# Patient Record
Sex: Male | Born: 1955 | Race: Black or African American | Hispanic: No | Marital: Married | Smoking: Current some day smoker
Health system: Southern US, Community
[De-identification: ages and names within clinical notes are randomized; demographics above are authoritative.]

## PROBLEM LIST (undated history)

## (undated) DIAGNOSIS — N200 Calculus of kidney: Secondary | ICD-10-CM

---

## 1991-11-10 HISTORY — PX: INGUINAL HERNIA REPAIR: SUR1180

## 1999-02-17 ENCOUNTER — Encounter: Payer: Self-pay | Admitting: Emergency Medicine

## 1999-02-17 ENCOUNTER — Emergency Department (HOSPITAL_COMMUNITY): Admission: EM | Admit: 1999-02-17 | Discharge: 1999-02-17 | Payer: Self-pay | Admitting: Emergency Medicine

## 1999-10-15 ENCOUNTER — Encounter: Payer: Self-pay | Admitting: Emergency Medicine

## 1999-10-15 ENCOUNTER — Emergency Department (HOSPITAL_COMMUNITY): Admission: EM | Admit: 1999-10-15 | Discharge: 1999-10-15 | Payer: Self-pay | Admitting: Emergency Medicine

## 2000-09-03 ENCOUNTER — Emergency Department (HOSPITAL_COMMUNITY): Admission: EM | Admit: 2000-09-03 | Discharge: 2000-09-03 | Payer: Self-pay | Admitting: Internal Medicine

## 2001-09-05 ENCOUNTER — Emergency Department (HOSPITAL_COMMUNITY): Admission: EM | Admit: 2001-09-05 | Discharge: 2001-09-05 | Payer: Self-pay | Admitting: Emergency Medicine

## 2001-09-05 ENCOUNTER — Encounter: Payer: Self-pay | Admitting: Emergency Medicine

## 2001-11-09 HISTORY — PX: WRIST FRACTURE SURGERY: SHX121

## 2003-10-15 ENCOUNTER — Emergency Department (HOSPITAL_COMMUNITY): Admission: EM | Admit: 2003-10-15 | Discharge: 2003-10-15 | Payer: Self-pay | Admitting: Emergency Medicine

## 2003-10-18 ENCOUNTER — Emergency Department (HOSPITAL_COMMUNITY): Admission: EM | Admit: 2003-10-18 | Discharge: 2003-10-18 | Payer: Self-pay | Admitting: Emergency Medicine

## 2003-10-22 ENCOUNTER — Emergency Department (HOSPITAL_COMMUNITY): Admission: EM | Admit: 2003-10-22 | Discharge: 2003-10-22 | Payer: Self-pay | Admitting: Emergency Medicine

## 2004-11-09 HISTORY — PX: KNEE SURGERY: SHX244

## 2006-09-22 ENCOUNTER — Ambulatory Visit: Payer: Self-pay | Admitting: *Deleted

## 2006-09-22 ENCOUNTER — Inpatient Hospital Stay (HOSPITAL_COMMUNITY): Admission: EM | Admit: 2006-09-22 | Discharge: 2006-09-24 | Payer: Self-pay | Admitting: Emergency Medicine

## 2006-10-11 ENCOUNTER — Ambulatory Visit: Payer: Self-pay | Admitting: Internal Medicine

## 2006-10-11 ENCOUNTER — Encounter (INDEPENDENT_AMBULATORY_CARE_PROVIDER_SITE_OTHER): Payer: Self-pay | Admitting: Ophthalmology

## 2006-10-11 LAB — CONVERTED CEMR LAB
Basophils Absolute: 0.1 10*3/uL (ref 0.0–0.1)
Basophils Relative: 1 % (ref 0–1)
ENA SM Ab Ser-aCnc: <0.2 (ref <1.0)
Eosinophils Relative: 2 % (ref 0–4)
HCT: 35.8 % — ABNORMAL LOW (ref 41.0–49.0)
Hemoglobin: 11.9 g/dL — ABNORMAL LOW (ref 13.9–16.8)
Lymphs Abs: 1.9 10*3/uL (ref 0.8–3.1)
Monocytes Absolute: 0.7 10*3/uL (ref 0.2–0.7)
Monocytes Relative: 9 % (ref 3–11)
Scleroderma (Scl-70) (ENA) Antibody, IgG: <0.2 (ref <1.0)
WBC: 7.5 10*3/uL (ref 3.7–10.0)

## 2006-10-25 ENCOUNTER — Ambulatory Visit: Payer: Self-pay | Admitting: *Deleted

## 2006-10-25 DIAGNOSIS — R030 Elevated blood-pressure reading, without diagnosis of hypertension: Secondary | ICD-10-CM | POA: Insufficient documentation

## 2006-10-25 DIAGNOSIS — M130 Polyarthritis, unspecified: Secondary | ICD-10-CM | POA: Insufficient documentation

## 2007-04-18 ENCOUNTER — Encounter (INDEPENDENT_AMBULATORY_CARE_PROVIDER_SITE_OTHER): Payer: Self-pay | Admitting: *Deleted

## 2007-04-18 ENCOUNTER — Ambulatory Visit: Payer: Self-pay | Admitting: Internal Medicine

## 2007-04-18 ENCOUNTER — Encounter: Payer: Self-pay | Admitting: *Deleted

## 2007-04-18 DIAGNOSIS — M6282 Rhabdomyolysis: Secondary | ICD-10-CM

## 2007-04-18 LAB — CONVERTED CEMR LAB
Ketones, urine, test strip: NEGATIVE
Nitrite: NEGATIVE
Specific Gravity, Urine: >=1.03
pH: 5

## 2007-04-19 LAB — CONVERTED CEMR LAB
AST: 30 units/L (ref 0–37)
Albumin: 4.1 g/dL (ref 3.5–5.2)
Alkaline Phosphatase: 72 units/L (ref 39–117)
Creatinine, Ser: 1.64 mg/dL — ABNORMAL HIGH (ref 0.40–1.50)
Glucose, Bld: 95 mg/dL (ref 70–99)
Ketones, ur: NEGATIVE mg/dL
Potassium: 4.5 meq/L (ref 3.5–5.3)
Protein, ur: 30 mg/dL — AB
Urine Glucose: NEGATIVE mg/dL
Urobilinogen, UA: 0.2 (ref 0.0–1.0)
pH: 5.5 (ref 5.0–8.0)

## 2007-05-19 ENCOUNTER — Inpatient Hospital Stay (HOSPITAL_COMMUNITY): Admission: EM | Admit: 2007-05-19 | Discharge: 2007-05-26 | Payer: Self-pay | Admitting: Emergency Medicine

## 2007-05-20 ENCOUNTER — Ambulatory Visit: Payer: Self-pay | Admitting: Vascular Surgery

## 2007-05-20 ENCOUNTER — Encounter (INDEPENDENT_AMBULATORY_CARE_PROVIDER_SITE_OTHER): Payer: Self-pay | Admitting: Internal Medicine

## 2007-05-23 ENCOUNTER — Ambulatory Visit: Payer: Self-pay | Admitting: Infectious Diseases

## 2007-08-12 ENCOUNTER — Emergency Department (HOSPITAL_COMMUNITY): Admission: EM | Admit: 2007-08-12 | Discharge: 2007-08-12 | Payer: Self-pay | Admitting: Emergency Medicine

## 2008-01-19 ENCOUNTER — Emergency Department (HOSPITAL_COMMUNITY): Admission: EM | Admit: 2008-01-19 | Discharge: 2008-01-20 | Payer: Self-pay | Admitting: Emergency Medicine

## 2008-01-23 ENCOUNTER — Emergency Department (HOSPITAL_COMMUNITY): Admission: EM | Admit: 2008-01-23 | Discharge: 2008-01-23 | Payer: Self-pay | Admitting: Family Medicine

## 2008-06-04 ENCOUNTER — Emergency Department (HOSPITAL_COMMUNITY): Admission: EM | Admit: 2008-06-04 | Discharge: 2008-06-04 | Payer: Self-pay | Admitting: Emergency Medicine

## 2008-06-07 ENCOUNTER — Emergency Department (HOSPITAL_COMMUNITY): Admission: EM | Admit: 2008-06-07 | Discharge: 2008-06-07 | Payer: Self-pay | Admitting: Family Medicine

## 2009-08-06 ENCOUNTER — Emergency Department (HOSPITAL_COMMUNITY): Admission: EM | Admit: 2009-08-06 | Discharge: 2009-08-06 | Payer: Self-pay | Admitting: Emergency Medicine

## 2009-11-03 ENCOUNTER — Emergency Department (HOSPITAL_COMMUNITY): Admission: EM | Admit: 2009-11-03 | Discharge: 2009-11-03 | Payer: Self-pay | Admitting: Emergency Medicine

## 2010-12-20 ENCOUNTER — Emergency Department (HOSPITAL_COMMUNITY)
Admission: EM | Admit: 2010-12-20 | Discharge: 2010-12-20 | Disposition: A | Discharge status: Home / Self Care | Payer: No Typology Code available for payment source | Attending: Emergency Medicine | Admitting: Emergency Medicine

## 2010-12-20 ENCOUNTER — Emergency Department (HOSPITAL_COMMUNITY): Payer: No Typology Code available for payment source

## 2010-12-20 DIAGNOSIS — M25519 Pain in unspecified shoulder: Secondary | ICD-10-CM | POA: Insufficient documentation

## 2010-12-20 DIAGNOSIS — IMO0002 Reserved for concepts with insufficient information to code with codable children: Secondary | ICD-10-CM | POA: Insufficient documentation

## 2010-12-20 DIAGNOSIS — R404 Transient alteration of awareness: Secondary | ICD-10-CM | POA: Insufficient documentation

## 2010-12-20 DIAGNOSIS — M545 Low back pain, unspecified: Secondary | ICD-10-CM | POA: Insufficient documentation

## 2010-12-20 DIAGNOSIS — S139XXA Sprain of joints and ligaments of unspecified parts of neck, initial encounter: Secondary | ICD-10-CM | POA: Insufficient documentation

## 2010-12-20 DIAGNOSIS — M25539 Pain in unspecified wrist: Secondary | ICD-10-CM | POA: Insufficient documentation

## 2011-03-24 NOTE — Op Note (Signed)
NAME:  [name], [name] NO.:  [id number]   MEDICAL RECORD NO.:  [id number]          PATIENT TYPE:  INP   LOCATION:  5150                         FACILITY:  [facility]   PHYSICIAN:  Ollen Gross, M.D.    DATE OF BIRTH:  [birth year]   DATE OF PROCEDURE:  05/21/2007  DATE OF DISCHARGE:                               OPERATIVE REPORT   PREOPERATIVE DIAGNOSIS:  Infected left knee prepatellar bursa.   POSTOPERATIVE DIAGNOSIS:  Infected left knee prepatellar bursa.   PROCEDURE:  Incision and drainage, left knee and leg.   SURGEON:  Ollen Gross, M.D.   ANESTHESIA:  General.   ESTIMATED BLOOD LOSS:  Minimal.   TOURNIQUET TIME:  24 minutes at 300 mmHg.   DRAINS:  Hemovac x1 sewn in   COMPLICATIONS:  None.  Condition stable to recovery.   CLINICAL NOTE:  [name] is a 55 year old male who has been in hospital  for about two and half days now with a draining left knee wound.  Began  after he removed the piece of wood from his leg while fishing  approximately week ago.  We were consulted today by the [facility]  hospitalist team to examine his knee.  Upon examining him, he has gross  pus draining out of his pre- patella bursa with significant pain and  significant surrounding cellulitis and skin slough.  It was decided that  he needs to go to the operating room urgently for debridement.   PROCEDURE IN DETAIL:  After successful administration of general  anesthetic a tourniquet placed on the left thigh and left lower  extremity prepped and draped in usual sterile fashion.  I elevated the  extremity but did not wrap it in Esmarch.  The tourniquet inflated 300  mmHg.  Midline incision was made over the open area.  I started with  about a three inch incision.  There was gross pus that came out through  the incision.  I extended it total of about 3 inches proximal to this in  4 inches to distal this until we got about an inch of healthy tissue on  each side.  He had a necrotic  bursa which is excised.  He was started  developed necrotizing fasciitis.  The fascia medial and lateral and is  debrided back until we got to healthy-appearing tissue.  The  subcutaneous tissue also was abnormal appearing and debrided back to  more normal subcu tissue.  We thoroughly irrigated with 2 liters of  saline solution at that time.  Inspecting the tissue, it had a healthy  appearance after the irrigation.  I released the tourniquet for total  time of 24 minutes.  Minor bleeding was stopped with cautery.  Once  again we had healthy appearing tissue about an inch on the proximal  aspect and distal aspect of the wounds prior to debriding and then after  debriding, all the tissue had  healthy appearance.  I then placed a large Hemovac drain into the wound  bed and closed subcu tissue with interrupted 2-0 Vicryl and skin with  staples.  I had ellipsed out the previous  open area.  The drain was  hooked to suction and a bulky sterile dressing applied.  He is awakened  and transported to recovery in stable condition.      Ollen Gross, M.D.  Electronically Signed     FA/MEDQ  D:  05/21/2007  T:  05/23/2007  Job:  [id number]

## 2011-03-24 NOTE — Discharge Summary (Signed)
NAME:  MICK, TANGUMA NO.:  [id number]   MEDICAL RECORD NO.:  [id number]          PATIENT TYPE:  INP   LOCATION:  5114                         FACILITY:  [facility]   PHYSICIAN:  Hind Bosie Helper, MD      DATE OF BIRTH:  1956/02/25   DATE OF ADMISSION:  05/19/2007  DATE OF DISCHARGE:                               DISCHARGE SUMMARY   PRIMARY CARE PHYSICIAN:  Dr. Mikeal Hawthorne, phone number [phone]   DISCHARGE DIAGNOSES:  1. Infected left knee prepatellar bursa.  2. Early necrotizing fasciitis, status post incision and drainage.  3. Chronic renal insufficiency.  4. Hypokalemia, status post replacement.  5. Hyponatremia, status post replacement.   CONSULTATIONS:  Orthopedics consulted for the infection of the left knee  and infectious disease was consulted for the severity of necrotizing  fasciitis.   PROCEDURE:  X-ray on May 19, 2007:  No acute bony finding, mildly  stable degenerative change.  CT extremity:  Diffuse anterior soft tissue  swelling, fluid and edema reflect hematoma from direct trauma,  prepatellar bursitis or cellulitis, small joint effusion but no other  significant intraarticular abnormalities.  Ultrasound of the kidney,  bilateral, echogenic nonobstructive kidney disease compatible with  sequelae of medical renal disease.  There is asmall pleural effusion.   HISTORY:  Please review the history done by Dr. Mikeal Hawthorne on the date of  admission.   PROBLEM LIST:  1. Prepatellar bursitis, with surrounding cellulitis and necrotizing      fasciitis.  The patient was admitted and started on Zosyn and      vancomycin.  On day two, no significant improvement in the above      infection.  Orthopedics was consulted for evaluation of the      prepatellar bursitis and for possible septic bursitis.  Orthopedics      nicely did the consult, where they recommended the patient had to      go immediately to incision and drainage of the infected left knee      prepatellar  bursa and early necrotizing fasciitis.  After incision      and drainage, left knee and leg swelling improved significantly.      Also recommend infectious disease consultation due to the severity      of the necrotizing fasciitis.  Infectious disease, done by Dr.      Ninetta Lights, recommended the patient be discharged on Bactrim broad      spectrum three times a day for three weeks.  During      hospitalization, patient significantly improved and had venous      Doppler done to his leg, which does not show any evidence of DVT on      his lower leg.  2. Mild elevated creatinine.  The patient did not respond to the IV      fluid.  The ratio between the BUN and creatinine goes with renal      disease more than prerenal.  Ultrasound of the renal function was      done, which showed evidence of medical lkidney disease with chronic  kidney disease.  Cause of the chronic kidney disease is unknown to      [place].  Patient needs to follow up with nephrologist as an outpatient.      Will order SPEP and urine protein and electrophoresis and      parathyroid hormone and ANA, which were pending.  Further      recommendation to be done by his primary care, where he should      check his renal function every month.  To complete the workup, will      order chest x-ray.   DISPOSITION:  Patient to be discharged home after orthopedics clears.  Follow with his primary care for further recommendations regarding his  deteriorating renal function.  Patient may need to follow his kidney  function with a nephrologist as an outpatient.      Hind Bosie Helper, MD  Electronically Signed     HIE/MEDQ  D:  05/24/2007  T:  05/25/2007  Job:  [id number]

## 2011-03-24 NOTE — Discharge Summary (Signed)
NAME:  [name], [name] NO.:  [id number]   MEDICAL RECORD NO.:  [id number]          PATIENT TYPE:  INP   LOCATION:  5114                         FACILITY:  [facility]   PHYSICIAN:  Lonia Blood, M.D.       DATE OF BIRTH:  03/31/1956   DATE OF ADMISSION:  05/19/2007  DATE OF DISCHARGE:  05/26/2007                               DISCHARGE SUMMARY   PRIMARY CARE PHYSICIAN:  Dr. Mikeal Hawthorne   DISCHARGE DIAGNOSIS:  Left lower extremity methicillin-resistant  Staphylococcus aureus cellulitis with early necrotizing fascitis, status  post incision and debridement by Dr. Lequita Halt.   For complete list of discharge diagnoses refer to the previously  dictated discharge summary by Dr. Geradine Girt.   DISCHARGE MEDICATIONS:  1. Doxycycline 100 mg twice a day.  2. Percocet 5/325 every 6 hours as needed for pain.   CONDITION ON DISCHARGE:  Patient was discharged in good condition.  At  the time of discharge, he was instructed to followup with Dr. Lequita Halt on  May 31, 2007.  Patient will also follow up with Dr. Mikeal Hawthorne for followup  creatinine.  The discharge creatinine on this patient is 1.6.   HOSPITAL COURSE:  For hospital course and procedures refer to the  previously dictated discharge summary done by Dr. Geradine Girt.   From July 16 until May 26, 2007, the patient was observed for 24 more  hours and to complete one week of intravenous vancomycin.  His  leukocytosis and wound have remained stable.  There is still a scant  discharge appearing material from the knee, but overall clinically, the  patient has markedly improved.   PLAN:  The plan is to treat the patient with two weeks of oral  doxycycline and have the patient follow up with Dr. Lequita Halt.  In regards  to the patient's renal insufficiency, this has remained stable  throughout this period of time.  The patient has adequate urinary  output.  He will follow up with Dr. Mikeal Hawthorne.      Lonia Blood, M.D.  Electronically Signed     SL/MEDQ  D:  05/26/2007  T:  05/26/2007  Job:  [id number]   cc:   Lonia Blood, M.D.  Ollen Gross, M.D.

## 2011-03-24 NOTE — H&P (Signed)
NAME:  ELEK, HOLDERNESS NO.:  [id number]   MEDICAL RECORD NO.:  [id number]          PATIENT TYPE:  INP   LOCATION:  5150                         FACILITY:  [facility]   PHYSICIAN:  Lonia Blood, M.D.      DATE OF BIRTH:  04/28/1956   DATE OF ADMISSION:  05/19/2007  DATE OF DISCHARGE:                              HISTORY & PHYSICAL   PRIMARY CARE PHYSICIAN:  The patient is unassigned, but he will follow-  up with Dr. Mikeal Hawthorne on discharge.  The phone number is [phone].   CHIEF COMPLAINT:  Left knee swelling and pain.   HISTORY OF PRESENT ILLNESS:  The patient is a 55 year old African-  American male with no significant past medical history who was out  fishing six days ago and fell in a creek.  After falling, he had a tick  that became imbedded in his knee.  He was able to remove it himself.  Since then, he has had pain which has become progressive.  He returned  to [place] from the site of his fishing, which was [place],  [place], and was able to function rudimentarily and even returned to work.  In the past 48 hours, however, the pain is getting worse.  He has had  chills and cold sweats at night, especially last night.  He therefore  decided to come to the emergency room for further management.  He denies  any nausea and vomiting.  The knee swelling has discouraged him from  walking or putting weight on his left knee.  No other joints involved  and no other sites involved.   PAST MEDICAL HISTORY:  His past medical history is significant for a  tick bite, for which he was admitted in the hospital six months ago.   ALLERGIES:  HE HAS NO KNOWN DRUG ALLERGIES.   MEDICATIONS:  None.   SOCIAL HISTORY:  The patient is married and lives in [place] with his  wife.  He works for [employer].  Denies any tobacco use, although he smokes  some cigars.  Social alcohol intake and no IV drug use.   FAMILY HISTORY:  Significant mainly for some hypertension.   REVIEW OF SYSTEMS:   A 12-point review of systems performed is negative  except for HPI.   PHYSICAL EXAMINATION:  VITAL SIGNS:  On exam, his temperature is 99.1.  Blood pressure 133/76.  Pulse of 94.  Respiratory rate 22.  Sats 99% on  room air.  GENERAL:  He is a pleasant man, sharp and in no acute distress.  HEENT:  PERRLA.  EOMI.  Neck is supple.  No JVD, no lymphadenopathy.  RESPIRATORY:  He has good air entry bilaterally.  No wheezes, no rales.  CARDIOVASCULAR SYSTEM:  He has S1, S2, no murmurs.  ABDOMEN:  Soft and nontender with positive bowel sounds.  EXTREMITIES:  Left knee swollen, warm with multiple blisters anteriorly,  oozing pus.  Also red compared to the right side.   LABORATORY DATA:  Sodium 130, potassium 3.2, chloride 96, CO2 of 26,  glucose 110, BUN 18, creatinine 1.42, calcium 8.5, total protein 8.2,  albumin 3.1, AST 35.  White count is 20.3, hemoglobin 12.2, platelet  count 255,000.  X-ray of the left knee essentially showed mainly soft  tissue swelling.  CT left knee also showed soft tissue swelling anterior  to the knee, questionable prepatellar bursitis with the cellulitis, etc.   ASSESSMENT:  Therefore, this is a 55 year old gentleman presenting with  what appears to be cellulitis involving the anterior aspect of his left  knee, secondary to tick bite.  He received his last tetanus shot six  months ago.  He has not taken any antibiotics, but it looks like for  presentation that he has been having fevers and chills as well as  obvious evidence of cellulitis.  Fortunately, his knee was not involved  at least from the CT.   PLAN:  1. Cellulitis.  We will admit the patient for IV antibiotics.  I will      give him both vanc and Zosyn and take blood cultures.  We will also      try and do wound cultures if possible.  Pain control and then      follow patient's progress while in the hospital.  We will get PT/OT      as well as wound care to assist with patient's care.  2.  Hypokalemia.  The course is not entirely clear, however, we will      replete his potassium.  3. Hyponatremia.  There is evidence of possible dehydration.  We will      give him some saline infusion in addition to following his      electrolytes.  Otherwise patient seems to be stable and follow-up      treatment will depend on how he does while in the hospital.      Lonia Blood, M.D.  Electronically Signed     LG/MEDQ  D:  05/19/2007  T:  05/20/2007  Job:  [id number]

## 2011-03-27 NOTE — Discharge Summary (Signed)
NAME:  [name], [name] NO.:  [id number]   MEDICAL RECORD NO.:  [id number]          PATIENT TYPE:  INP   LOCATION:  6708                         FACILITY:  [facility]   PHYSICIAN:  Manning Charity, MD     DATE OF BIRTH:  1956/04/18   DATE OF ADMISSION:  09/22/2006  DATE OF DISCHARGE:  09/24/2006                               DISCHARGE SUMMARY   DISCHARGE DIAGNOSIS:  1. Polyarthritis, unknown etiology.  2. Nausea, vomiting and diarrhea, resolved prior to admission.  3. Leukocytosis, resolving.  4. Anemia.  5. Hypoalbuminemia.  6. History of an inguinal hernia repair in 1995.   DISCHARGE MEDICATIONS:  1. Doxycycline 100 mg p.o. b.i.d. x7 days.  2. Ibuprofen 200 mg p.o. q.6 hours p.r.n. pain.   CONDITION AT DISCHARGE:  Was stable and improved.  The patient was  instructed to follow up with Dr. Antony Contras in the outpatient clinic  on October 11, 2006 at 9 o'clock in the morning.  He was instructed to  call the outpatient clinic if he had any problems after going home.  The  patient had improved dramatically and we felt it would be safe to  discharge him home on p.o. doxycycline to complete a 10-day course of  antibiotic therapy.  On hospital follow-up it should be considered to  send the patient to a rheumatologist for further investigation regarding  his polyarthritis.   PROCEDURES:  1. A bilateral wrist x-ray on September 22, 2006 showed left wrist had      no acute bony abnormality.  Specifically no fracture, subluxation      or dislocation.  He had degenerative changes of the first carpal      metacarpal joint.  The right wrist revealed mild degenerative      changes of the first carpometacarpal joint.  No fracture,      subluxation or dislocation.  2. Chest x-ray on September 22, 2006 showed heart size was normal.      Vascularity was normal.  The lung volume is normal.  The lungs were      clear.  Negative for pneumonia.  There is a very small pleural  effusion on the lateral view.  3. Bilateral knee x-rays on September 22, 2006 showed left knee had a      moderate joint effusion present.  There is mild degenerative      changes noted.  No acute bony abnormality.  The right knee revealed      no acute bony abnormality.  Specifically no fracture, subluxation      or dislocation.  Mild degenerative changes.  No joint effusion.   CONSULTATIONS:  None.   ADMISSION H&P:  Please see the chart for full details. In summary Mr.  [name] is a 55 year old African American man with no significant past  medical history who presented to the emergency department complaining of  joint pain x2-1/2 weeks.  His complaints according to the patient  started about 3 weeks ago when he went fishing and received a tick bite  on his right knee.  About 5 days later he felt that tingling  on his head  and lower extremities and back.  The next day he had swelling of his  right foot and ankle and his right knees and hips in the migratory  pattern.  The swelling involved his elbows and now bilateral wrists and  fingers.  He did have some subjective fevers and chills on and off.  Also at some point a sore throat.  He has also had a cough for the past  5 days, some nausea, vomiting x1, and diarrhea on and off for the past  day.  He has been taking aspirin, Tylenol and Advil but no relief.  No  rash was seen by the patient but his wife told him 1 day 1-2 weeks prior  to admission that he had a rash of some type on his back.   PHYSICAL EXAMINATION:  VITAL SIGNS: Temperature was 97 degrees, blood  pressure 128/87, pulse 85, respiration rate 20 and he was saturating 98%  on room air.  Pertinent physical exam findings include:  NECK:  He had no neck stiffness.  LUNGS:  Lungs were clear with some faint rhonchi at the bases but no  crackles.  HEART:  Regular rate and rhythm with no murmurs.  ABDOMEN:  Benign.  EXTREMITIES:  He had no lower extremity edema.  Pulses were  normal.  Testes were normal.  Penis was normal and no ulcers or lesions were  noted.  Skin was warm.  There is no rash noted.  He had no  lymphadenopathy.  MUSCULOSKELETAL:  Exam revealed normal range of motion of the shoulders  and elbows. Wrists were tender to palpation with probably some effusion  bilaterally at the MCP joints of the third through fifth fingers, the  interphalangeal joints bilaterally.  His knees were mildly tender to  palpation with a mild effusion on the left.  Ankles and feet were  normal.   LABORATORY DATA ON ADMISSION:  Sodium 136, potassium 3.6, chloride 101,  bicarb 26, glucose 100, BUN 15, creatinine 0.9, total bili was 0.7,  alkaline phosphatase 76, AST 24, ALT 24, total protein 7.2, albumin 2.9,  calcium 8.4. White blood cell count 17.8, hemoglobin 12.8, hematocrit  38.4, MCV 84, RDW 13.9, platelets 231, ANC 14.6. UA revealed a specific  gravity of 1.034, 15 of ketones and 100 protein with 0-2 WBCs, 3-6 RBCs,  rare bacteria and mucus.   HOSPITAL COURSE:  1. Polyarthritis of unknown etiology. The patient, because of his      history of a question of a tick bite even though it was 3 weeks      prior to admission, was started on doxycycline immediately upon      arrival to the emergency department.  He did have a moderate left      knee effusion that was unable to be tapped prior to the start of      antibiotics.  Given his bilateral wrist tenderness and edema as      well as his left knee edema, it was felt that the patient had some      sort of a polyarthritis.  A rheumatological workup was begun at      that time and given his leukocytosis and presenting complaints.      The patient received doxycycline immediately in the emergency      department and throughout his hospitalization and reported dramatic      improvement in his symptoms 24 hours after the first dose of his  doxycycline.  The wrist tenderness, restriction in range of motion,     and  edema all decreased quite significantly over the first 24 hours      of his hospitalization and the left knee effusion was practically      gone 24 hours after the first dose of doxycycline.  This is felt to      be a bit unusual and felt to be less likely secondary to the      doxycycline and more likely spontaneous resolution.  Given the      likely rheumatological nature of this patient's presentation we did      obtain several rheumatological laboratories including an ANA which      was positive at 1:40 with a speckled pattern.  C-reactive protein      was elevated at 17.7.  His rheumatoid factor was elevated to 35 and      his ESR was elevated at 77.  Given the leukocytosis and normocytic      anemia, there is a strong possibility that this patient does have a      rheumatological disease that has not been delineated as of yet.  He      will need close follow-up in the outpatient setting including      perhaps a referral to rheumatologist for further workup.  Given his      dramatic improvement on the doxycycline it was felt that it would      be safe to discharge him as he was afebrile and his leukocytosis      was resolving and his complaints were resolving on doxycycline to      complete his 10-day course.  Of note his GC and chlamydia probe was      negative which ruled out a Gonococcal arthritis, his [facility]      Spotted Fever antibody was negative which ruled out RMSF as a cause      of his presenting symptoms.  His HIV antibody was nonreactive and      his HIV RNA was essentially negative.  The RPR was nonreactive.      Influenza A and B was negative as well.  Urinary drug screen was      only positive for THC and opiates.  Blood cultures were negative x2      and finalized after 5 days.  His UA on repeat was negative.  His      chest x-ray did not reveal pneumonia as possible source of      infection.  We felt that the leukocytosis was most likely a      reactive  leukocytosis secondary to an underlying rheumatological      disorder.  Again the patient would probably benefit from an      outpatient workup by a rheumatologist at this point.  And again      because he was stable and afebrile and everything was resolving it      was felt to be safe to discharge him on p.o. antibiotics with close      follow-up in the outpatient clinic.  2. Nausea, vomiting and diarrhea.  The patient presented with a      complaint of nausea and vomiting x1 and diarrhea.  However, this      seemed to have resolved prior to admission.  The patient did not      experience any nausea, vomiting or diarrhea during his short  hospitalization.  Initially he was given IV fluids to volume      replete him but was able to tolerate p.o.'s his second day of      hospitalization and these were discontinued and he was monitored on      just p.o. intake.  The patient did well and was discharged without      any further episodes of nausea, vomiting or diarrhea reported. 3. Leukocytosis. On the day of admission his white blood cell count      was 17.8 which decreased to 16.1 and then 15 on the subsequent days      of hospitalization.  Since the patient's leukocytosis and symptoms      were improving on doxycycline we decided to continue this for a      total of 10 days of antibiotics.  His leukocytosis was not found to      have been from an infectious etiology as his chest x-ray was clear.      His urinalysis was negative and his blood cultures were negative as      well.  It was felt to be more likely secondary to an underlying      rheumatological disorder and this will be further worked up as an      outpatient.  A CBC should be checked in the outpatient setting to      ensure resolution of the leukocytosis.  The patient remained      afebrile throughout his hospitalization.  4. Anemia, normocytic. The patient's hemoglobin on the day of      admission was 12.8 with an MCV of 84  and an RDW of 13.9.  Given      this constellation of symptoms this could be an acute anemia      secondary to inflammatory reaction from underlying rheumatological      disorder.  The patient did not have any signs or symptoms of blood      loss. His blood pressure remained stable.  He was not orthostatic      and his kidney function was within normal limits.  The patient is      55 years old and certainly would benefit from an outpatient      screening colonoscopy given his newly diagnosed normocytic anemia.      He will also need an anemia panel and to have a Hemoccult as an      outpatient as well.  Since there are no signs of overt bleeding, it      was felt to be appropriate to focus on his presenting complaints      and leave the anemia workup as an outpatient process.  The      patient's hemoglobin on the day of discharge was 11.1 and he was      asymptomatic.  5. Hypoalbuminemia. It was noted on the day of admission that the      patient's albumin was 2.9.  While his urine drug screen was      negative for most illicit drugs, it was positive for marijuana use      which may be contributing to his hypoalbuminemic state.  The      patient did also admit to daily alcohol use.  The patient has no      other past medical history to account for the hypoalbuminemia and      this was also considered to be part of the likely underlying  rheumatological disorder associated with the patient's presenting      complaints.  He could also be hypoalbuminemic secondary to his      acute illness and we can check for resolution and in the outpatient      setting as well.  Of note, it does not appear that he has any liver      disease that would be contributing to his low albumin.   DISCHARGE LABS AND VITAL SIGNS:  On the day of discharge temperature was  98.3, pulse was 50, respirations 18, blood pressure 114/61, and he was  saturating at 95% on room air. White blood cell count was 15,   hemoglobin 11.1, hematocrit 32.7, MCV 84.2, RDW 14, platelets 233. Sodium 136, potassium 3.9, chloride 102, bicarb 26, glucose 88, BUN 11,  creatinine 0.9, calcium 8.6.   PENDING LABS:  None.      Chauncey Reading, D.O.  Electronically Signed     ______________________________  Manning Charity, MD    EA/MEDQ  D:  10/18/2006  T:  10/18/2006  Job:  [id number]   cc:   Antony Contras, M.D.

## 2011-08-07 LAB — URINALYSIS, ROUTINE W REFLEX MICROSCOPIC
Bilirubin Urine: NEGATIVE
Glucose, UA: NEGATIVE
Ketones, ur: NEGATIVE
Leukocytes, UA: NEGATIVE
pH: 7

## 2011-08-07 LAB — DIFFERENTIAL
Basophils Relative: 0
Eosinophils Absolute: 0
Eosinophils Relative: 0
Neutrophils Relative %: 85 — ABNORMAL HIGH

## 2011-08-07 LAB — COMPREHENSIVE METABOLIC PANEL
ALT: 25
AST: 36
Alkaline Phosphatase: 65
CO2: 24
Chloride: 103
GFR calc Af Amer: 52 — ABNORMAL LOW
GFR calc non Af Amer: 43 — ABNORMAL LOW
Glucose, Bld: 137 — ABNORMAL HIGH
Potassium: 3.7
Sodium: 142

## 2011-08-07 LAB — URINE MICROSCOPIC-ADD ON

## 2011-08-07 LAB — POCT I-STAT, CHEM 8
Glucose, Bld: 140 — ABNORMAL HIGH
Hemoglobin: 13.9
Sodium: 145

## 2011-08-07 LAB — URINE CULTURE: Culture: NO GROWTH

## 2011-08-07 LAB — POCT URINALYSIS DIP (DEVICE)
Ketones, ur: NEGATIVE
Operator id: 247071
Protein, ur: 30 — AB
Specific Gravity, Urine: >=1.03
Urobilinogen, UA: 0.2
pH: 5

## 2011-08-07 LAB — CBC
Hemoglobin: 12.8 — ABNORMAL LOW
RBC: 4.67
WBC: 11.3 — ABNORMAL HIGH

## 2011-08-07 LAB — LIPASE, BLOOD: Lipase: 25

## 2011-08-20 LAB — I-STAT 8, (EC8 V) (CONVERTED LAB)
Acid-base deficit: 1
BUN: 32 — ABNORMAL HIGH
Bicarbonate: 23.8
HCT: 39
Hemoglobin: 13.3
Operator id: 272551
Sodium: 139
pCO2, Ven: 40.1 — ABNORMAL LOW

## 2011-08-20 LAB — POCT I-STAT CREATININE: Creatinine, Ser: 1.4

## 2011-08-24 LAB — URINALYSIS, ROUTINE W REFLEX MICROSCOPIC
Bilirubin Urine: NEGATIVE
Glucose, UA: NEGATIVE
Ketones, ur: NEGATIVE
Nitrite: NEGATIVE
Protein, ur: NEGATIVE
Specific Gravity, Urine: 1.005
Urobilinogen, UA: 0.2
pH: 7

## 2011-08-24 LAB — BASIC METABOLIC PANEL
BUN: 8
BUN: 9
CO2: 27
Calcium: 8.5
Calcium: 8.5
Calcium: 8.7
Chloride: 102
Chloride: 105
Chloride: 105
Creatinine, Ser: 1.6 — ABNORMAL HIGH
Creatinine, Ser: 1.61 — ABNORMAL HIGH
Creatinine, Ser: 1.66 — ABNORMAL HIGH
GFR calc Af Amer: 53 — ABNORMAL LOW
GFR calc Af Amer: 55 — ABNORMAL LOW
GFR calc non Af Amer: 45 — ABNORMAL LOW
Glucose, Bld: 154 — ABNORMAL HIGH
Potassium: 3.5
Sodium: 137
Sodium: 139

## 2011-08-24 LAB — CBC
HCT: 31.7 — ABNORMAL LOW
Hemoglobin: 10.6 — ABNORMAL LOW
MCHC: 33.3
MCV: 83.2
Platelets: 310
RBC: 3.82 — ABNORMAL LOW
RBC: 3.96 — ABNORMAL LOW
RDW: 14.7 — ABNORMAL HIGH
WBC: 17.2 — ABNORMAL HIGH
WBC: 17.7 — ABNORMAL HIGH

## 2011-08-24 LAB — URINE MICROSCOPIC-ADD ON

## 2011-08-25 LAB — TISSUE CULTURE

## 2011-08-25 LAB — DIFFERENTIAL
Basophils Absolute: 0
Basophils Absolute: 0
Basophils Absolute: 0
Basophils Relative: 0
Basophils Relative: 0
Eosinophils Absolute: 0.1
Eosinophils Absolute: 0.1
Eosinophils Relative: 1
Eosinophils Relative: 1
Lymphocytes Relative: 15
Lymphocytes Relative: 9 — ABNORMAL LOW
Lymphs Abs: 1.2
Lymphs Abs: 1.7
Lymphs Abs: 2.2
Monocytes Absolute: 2 — ABNORMAL HIGH
Monocytes Relative: 11
Monocytes Relative: 8
Monocytes Relative: 8
Neutro Abs: 11.5 — ABNORMAL HIGH
Neutro Abs: 14 — ABNORMAL HIGH
Neutro Abs: 17.5 — ABNORMAL HIGH
Neutrophils Relative %: 76
Neutrophils Relative %: 79 — ABNORMAL HIGH

## 2011-08-25 LAB — BASIC METABOLIC PANEL
BUN: 9
CO2: 28
Calcium: 8 — ABNORMAL LOW
Calcium: 8.6
Chloride: 101
Creatinine, Ser: 1.56 — ABNORMAL HIGH
Creatinine, Ser: 1.58 — ABNORMAL HIGH
GFR calc Af Amer: 56 — ABNORMAL LOW
GFR calc non Af Amer: 46 — ABNORMAL LOW
GFR calc non Af Amer: 47 — ABNORMAL LOW
GFR calc non Af Amer: >60
Glucose, Bld: 108 — ABNORMAL HIGH
Glucose, Bld: 111 — ABNORMAL HIGH
Glucose, Bld: 99
Potassium: 3.7
Potassium: 3.9
Sodium: 134 — ABNORMAL LOW
Sodium: 135
Sodium: 137

## 2011-08-25 LAB — WOUND CULTURE

## 2011-08-25 LAB — CBC
HCT: 33.7 — ABNORMAL LOW
HCT: 34.8 — ABNORMAL LOW
Hemoglobin: 10.5 — ABNORMAL LOW
Hemoglobin: 11.3 — ABNORMAL LOW
Hemoglobin: 11.7 — ABNORMAL LOW
MCHC: 33.1
MCV: 83.5
MCV: 83.9
Platelets: 255
Platelets: 279
RBC: 4.12 — ABNORMAL LOW
RBC: 4.21 — ABNORMAL LOW
RDW: 15.1 — ABNORMAL HIGH
RDW: 15.4 — ABNORMAL HIGH
RDW: 15.5 — ABNORMAL HIGH
WBC: 15.2 — ABNORMAL HIGH
WBC: 18 — ABNORMAL HIGH
WBC: 18.9 — ABNORMAL HIGH
WBC: 20.3 — ABNORMAL HIGH

## 2011-08-25 LAB — ANAEROBIC CULTURE

## 2011-08-25 LAB — COMPREHENSIVE METABOLIC PANEL
AST: 35
Albumin: 3.1 — ABNORMAL LOW
Alkaline Phosphatase: 82
Chloride: 96
GFR calc Af Amer: >60
Potassium: 3.2 — ABNORMAL LOW
Sodium: 130 — ABNORMAL LOW
Total Bilirubin: 1.1

## 2011-08-25 LAB — CULTURE, BLOOD (ROUTINE X 2): Culture: NO GROWTH

## 2011-08-25 LAB — PROTEIN ELECTROPH W RFLX QUANT IMMUNOGLOBULINS
Beta 2: 8.4 — ABNORMAL HIGH
Gamma Globulin: 26.7 — ABNORMAL HIGH

## 2011-08-25 LAB — VANCOMYCIN, TROUGH: Vancomycin Tr: 18

## 2013-12-07 ENCOUNTER — Encounter: Payer: Self-pay | Admitting: Gastroenterology

## 2014-01-11 ENCOUNTER — Ambulatory Visit (AMBULATORY_SURGERY_CENTER): Payer: Self-pay | Admitting: *Deleted

## 2014-01-11 VITALS — Ht 76.0 in | Wt 208.4 lb

## 2014-01-11 DIAGNOSIS — Z1211 Encounter for screening for malignant neoplasm of colon: Secondary | ICD-10-CM

## 2014-01-11 MED ORDER — NA SULFATE-K SULFATE-MG SULF 17.5-3.13-1.6 GM/177ML PO SOLN: 1.0000 | Freq: Once | ORAL | Status: DC

## 2014-01-11 NOTE — Progress Notes (Signed)
No allergies to eggs or soy. No problems with anesthesia.  

## 2014-01-15 ENCOUNTER — Encounter: Payer: Self-pay | Admitting: Gastroenterology

## 2014-01-25 ENCOUNTER — Encounter: Payer: BC Managed Care – PPO | Admitting: Gastroenterology

## 2014-03-07 ENCOUNTER — Encounter: Payer: BC Managed Care – PPO | Admitting: Gastroenterology

## 2015-05-21 ENCOUNTER — Emergency Department (HOSPITAL_COMMUNITY): Payer: BLUE CROSS/BLUE SHIELD

## 2015-05-21 ENCOUNTER — Emergency Department (HOSPITAL_COMMUNITY)
Admission: EM | Admit: 2015-05-21 | Discharge: 2015-05-21 | Disposition: A | Discharge status: Home / Self Care | Payer: BLUE CROSS/BLUE SHIELD | Attending: Emergency Medicine | Admitting: Emergency Medicine

## 2015-05-21 ENCOUNTER — Encounter (HOSPITAL_COMMUNITY): Payer: Self-pay | Admitting: Emergency Medicine

## 2015-05-21 DIAGNOSIS — R319 Hematuria, unspecified: Secondary | ICD-10-CM | POA: Diagnosis present

## 2015-05-21 DIAGNOSIS — N200 Calculus of kidney: Secondary | ICD-10-CM | POA: Diagnosis not present

## 2015-05-21 DIAGNOSIS — Z79899 Other long term (current) drug therapy: Secondary | ICD-10-CM | POA: Insufficient documentation

## 2015-05-21 DIAGNOSIS — R109 Unspecified abdominal pain: Secondary | ICD-10-CM

## 2015-05-21 DIAGNOSIS — Z720 Tobacco use: Secondary | ICD-10-CM | POA: Diagnosis not present

## 2015-05-21 HISTORY — DX: Calculus of kidney: N20.0

## 2015-05-21 LAB — CBC
HEMATOCRIT: 38.6 % — AB (ref 39.0–52.0)
Hemoglobin: 12.6 g/dL — ABNORMAL LOW (ref 13.0–17.0)
MCH: 27.2 pg (ref 26.0–34.0)
MCHC: 32.6 g/dL (ref 30.0–36.0)
MCV: 83.2 fL (ref 78.0–100.0)
PLATELETS: 247 10*3/uL (ref 150–400)
RBC: 4.64 MIL/uL (ref 4.22–5.81)
RDW: 15.2 % (ref 11.5–15.5)
WBC: 11.3 10*3/uL — ABNORMAL HIGH (ref 4.0–10.5)

## 2015-05-21 LAB — URINE MICROSCOPIC-ADD ON

## 2015-05-21 LAB — COMPREHENSIVE METABOLIC PANEL
ALBUMIN: 3.5 g/dL (ref 3.5–5.0)
ALK PHOS: 62 U/L (ref 38–126)
ALT: 23 U/L (ref 17–63)
AST: 25 U/L (ref 15–41)
Anion gap: 11 (ref 5–15)
BUN: 20 mg/dL (ref 6–20)
CALCIUM: 9.3 mg/dL (ref 8.9–10.3)
CO2: 22 mmol/L (ref 22–32)
CREATININE: 1.51 mg/dL — AB (ref 0.61–1.24)
Chloride: 105 mmol/L (ref 101–111)
GFR calc non Af Amer: 48 mL/min — ABNORMAL LOW (ref >60)
GFR, EST AFRICAN AMERICAN: 56 mL/min — AB (ref >60)
GLUCOSE: 134 mg/dL — AB (ref 65–99)
Potassium: 3.5 mmol/L (ref 3.5–5.1)
Sodium: 138 mmol/L (ref 135–145)
TOTAL PROTEIN: 8 g/dL (ref 6.5–8.1)
Total Bilirubin: 0.4 mg/dL (ref 0.3–1.2)

## 2015-05-21 LAB — URINALYSIS, ROUTINE W REFLEX MICROSCOPIC
BILIRUBIN URINE: NEGATIVE
GLUCOSE, UA: NEGATIVE mg/dL
KETONES UR: NEGATIVE mg/dL
LEUKOCYTES UA: NEGATIVE
Nitrite: NEGATIVE
PH: 5 (ref 5.0–8.0)
PROTEIN: NEGATIVE mg/dL
Specific Gravity, Urine: 1.011 (ref 1.005–1.030)
Urobilinogen, UA: 0.2 mg/dL (ref 0.0–1.0)

## 2015-05-21 MED ORDER — OXYCODONE-ACETAMINOPHEN 5-325 MG PO TABS
2.0000 | ORAL_TABLET | Freq: Once | ORAL | Status: AC
  Administered 2015-05-21: 2 via ORAL
  Filled 2015-05-21: qty 2

## 2015-05-21 MED ORDER — OXYCODONE-ACETAMINOPHEN 5-325 MG PO TABS
1.0000 | ORAL_TABLET | Freq: Four times a day (QID) | ORAL | Status: DC | PRN
Start: 2015-05-21 — End: 2018-07-13

## 2015-05-21 MED ORDER — HYDROMORPHONE HCL 1 MG/ML IJ SOLN: 1.0000 mg | Freq: Once | INTRAMUSCULAR | Status: DC

## 2015-05-21 MED ORDER — HYDROMORPHONE HCL 1 MG/ML IJ SOLN
1.0000 mg | Freq: Once | INTRAMUSCULAR | Status: AC
Start: 2015-05-21 — End: 2015-05-21
  Administered 2015-05-21: 1 mg via INTRAMUSCULAR
  Filled 2015-05-21: qty 1

## 2015-05-21 MED ORDER — TAMSULOSIN HCL 0.4 MG PO CAPS: 0.4000 mg | ORAL_CAPSULE | Freq: Every day | ORAL | Status: AC

## 2015-05-21 MED ORDER — ONDANSETRON HCL 4 MG PO TABS: 4.0000 mg | ORAL_TABLET | Freq: Four times a day (QID) | ORAL | Status: AC

## 2015-05-21 MED ORDER — METOCLOPRAMIDE HCL 5 MG/ML IJ SOLN
10.0000 mg | Freq: Once | INTRAMUSCULAR | Status: AC
  Administered 2015-05-21: 10 mg via INTRAVENOUS
  Filled 2015-05-21: qty 2

## 2015-05-21 MED ORDER — KETOROLAC TROMETHAMINE 30 MG/ML IJ SOLN
30.0000 mg | Freq: Once | INTRAMUSCULAR | Status: AC
  Administered 2015-05-21: 30 mg via INTRAVENOUS
  Filled 2015-05-21: qty 1

## 2015-05-21 MED ORDER — SODIUM CHLORIDE 0.9 % IV BOLUS (SEPSIS)
1000.0000 mL | Freq: Once | INTRAVENOUS | Status: AC
  Administered 2015-05-21: 1000 mL via INTRAVENOUS

## 2015-05-21 NOTE — ED Notes (Signed)
PA at bedside.

## 2015-05-21 NOTE — ED Notes (Signed)
Pt. reports hematuria with right abdominal/right flank pain onset this evening , pt. stated history of kidney stone / denies fever .

## 2015-05-21 NOTE — Discharge Instructions (Signed)

## 2015-05-21 NOTE — ED Provider Notes (Signed)
CSN: [id number]     Arrival date & time 05/21/15  0217 History   This chart was scribed for non-physician practitioner, Antony Madura, PA-C  working with Shon Baton, MD by Arlan Organ, ED Scribe. This patient was seen in room A02C/A02C and the patient's care was started at 2:50 AM.   Chief Complaint  Patient presents with  . Hematuria   The history is provided by the patient. No language interpreter was used.    HPI Comments: [name] is a 59 y.o. male with a PMHx of kidney stones who presents to the Emergency Department complaining of sudden onset, constant, ongoing hematuria onset earlier this evening. Pt states he noted a "stone" in the toilet after using the bathroom. [name] also reports nausea, vomiting, R sided abdominal pain, and R sided flank pain. 3 episodes of vomiting reports since time of onset. No OTC medications or home remedies attempted for any of the above symptoms. No recent fever or chills. No previous stent placement or treatments needed for previous kidney stones. No known allergies to medications.  Past Medical History  Diagnosis Date  . Kidney stones    Past Surgical History  Procedure Laterality Date  . Inguinal hernia repair Left 1993  . Wrist fracture surgery Left 2003    with hardware  . 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 Colon cancer Neg Hx    History  Substance Use Topics  . Smoking status: Current Some Day Smoker    Types: Cigars  . Smokeless tobacco: Never Used     Comment: occasional cigar  . Alcohol Use: 0.0 oz/week     Comment: beer several times a year    Review of Systems  Constitutional: Negative for fever and chills.  Gastrointestinal: Positive for nausea, vomiting and abdominal pain.  Genitourinary: Positive for hematuria and flank pain.  All other systems reviewed and are negative.   Allergies  Review of patient's allergies indicates no known  allergies.  Home Medications   Prior to Admission medications   Medication Sig Start Date End Date Taking? Authorizing Provider  Multiple Vitamin (MULTIVITAMIN WITH MINERALS) TABS tablet Take 1 tablet by mouth daily.   Yes Historical Provider, MD  ondansetron (ZOFRAN) 4 MG tablet Take 1 tablet (4 mg total) by mouth every 6 (six) hours. 05/21/15   Antony Madura, PA-C  oxyCODONE-acetaminophen (PERCOCET/ROXICET) 5-325 MG per tablet Take 1-2 tablets by mouth every 6 (six) hours as needed for severe pain. 05/21/15   Antony Madura, PA-C  tamsulosin (FLOMAX) 0.4 MG CAPS capsule Take 1 capsule (0.4 mg total) by mouth daily. 05/21/15   Antony Madura, PA-C   Triage Vitals: BP 156/81 mmHg  Pulse 58  Temp(Src) 97.9 F (36.6 C) (Oral)  Resp 17  Ht 6\' 4"  (1.93 m)  SpO2 99%   Physical Exam  Constitutional: He is oriented to person, place, and time. He appears well-developed and well-nourished. No distress.  Patient appears uncomfortable.  HENT:  Head: Normocephalic and atraumatic.  Eyes: Conjunctivae and EOM are normal. No scleral icterus.  Neck: Normal range of motion.  Cardiovascular: Normal rate, regular rhythm and intact distal pulses.   Pulmonary/Chest: Effort normal and breath sounds normal. No respiratory distress. He has no wheezes. He has no rales.  Respirations even and unlabored  Abdominal: Soft. He exhibits no distension. There is tenderness. There is no rebound and no guarding.  TTP in  the R mid abdomen and R suprapubic region. Abdomen soft. No masses or peritoneal signs.  Musculoskeletal: Normal range of motion.  Neurological: He is alert and oriented to person, place, and time. He exhibits normal muscle tone. Coordination normal.  Skin: Skin is warm and dry. No rash noted. He is not diaphoretic. No erythema. No pallor.  Psychiatric: He has a normal mood and affect. His behavior is normal.  Nursing note and vitals reviewed.   ED Course  Procedures (including critical care  time)  DIAGNOSTIC STUDIES: Oxygen Saturation is 99% on RA, Normal by my interpretation.    COORDINATION OF CARE: 2:53 AM- Will give fluids, Reglan, Dilaudid, and Toradol. Will order urinalysis, CBC, and CMP. Discussed treatment plan with pt at bedside and pt agreed to plan.     Labs Review Labs Reviewed  COMPREHENSIVE METABOLIC PANEL - Abnormal; Notable for the following:    Glucose, Bld 134 (*)    Creatinine, Ser 1.51 (*)    GFR calc non Af Amer 48 (*)    GFR calc Af Amer 56 (*)    All other components within normal limits  CBC - Abnormal; Notable for the following:    WBC 11.3 (*)    Hemoglobin 12.6 (*)    HCT 38.6 (*)    All other components within normal limits  URINALYSIS, ROUTINE W REFLEX MICROSCOPIC (NOT AT [facility]) - Abnormal; Notable for the following:    Hgb urine dipstick LARGE (*)    All other components within normal limits  URINE MICROSCOPIC-ADD ON    Imaging Review Ct Renal Stone Study  05/21/2015   CLINICAL DATA:  Sudden onset severe right flank pain. Gross hematuria. History of kidney stones.  EXAM: CT ABDOMEN AND PELVIS WITHOUT CONTRAST  TECHNIQUE: Multidetector CT imaging of the abdomen and pelvis was performed following the standard protocol without IV contrast.  COMPARISON:  None.  FINDINGS: Dependent atelectasis in the lung bases.  There is a 4 mm stone in the mid right ureter at the level of L3-4. There is proximal ureterectasis and hydronephrosis with distal decompression of the ureter. There is prominent stranding around the right kidney in ureter. Additional intrarenal stones in both kidneys. No hydronephrosis or hydroureter on the left. No bladder stones. Bladder wall is mildly thickened.  Unenhanced appearance of the liver, spleen, gallbladder, pancreas, adrenal glands, abdominal aorta, inferior vena cava, and retroperitoneal lymph nodes is unremarkable. Stomach, small bowel, and colon are not abnormally distended. No free air or free fluid in the abdomen.   Pelvis: Appendix is normal. Prostate gland is enlarged. No free or loculated pelvic fluid collections. No pelvic mass or lymphadenopathy. Degenerative changes in the spine. No destructive bone lesions.  IMPRESSION: 4 mm stone in the mid right ureter with moderate proximal obstruction. Additional nonobstructing intrarenal stones bilaterally.   Electronically Signed   By: Burman Nieves M.D.   On: 05/21/2015 03:49     EKG Interpretation None      MDM   Final diagnoses:  Right flank pain  Kidney stone    Pt has been diagnosed with a kidney stone via CT. CT shows moderate hydronephrosis; creatinine at baseline compared to prior work ups. UA negative for UTI. Vitals sign stable. Patient afebrile and he is not experiencing irratractable vomiting. His pain is well controlled after Toradol and Dilaudid. Pt will be discharged home with pain medications and Flomax. He has been advised to follow-up with a urologist and his primary care provider. Return precautions discussed and  provided. Patient agreeable to plan with no unaddressed concerns. Patient discharged in good condition; VSS.  I personally performed the services described in this documentation, which was scribed in my presence. The recorded information has been reviewed and is accurate.   Filed Vitals:   05/21/15 0400 05/21/15 0415 05/21/15 0430 05/21/15 0500  BP: 156/82 153/79 156/81 149/81  Pulse: 54 54 58 49  Temp:      TempSrc:      Resp: 17 15 17 15   Height:      SpO2: 96% 97% 99% 100%     Antony MaduraKelly Dorethia Jeanmarie, PA-C 05/21/15 [id number]  Shon Batonourtney F Horton, MD 05/21/15 [id number]

## 2015-05-21 NOTE — ED Notes (Signed)
Notified PA that pt is sinus brady and HR is between 50-45

## 2015-05-21 NOTE — ED Notes (Signed)
Patient transported to CT 

## 2017-09-14 ENCOUNTER — Other Ambulatory Visit: Payer: Self-pay

## 2017-09-14 ENCOUNTER — Emergency Department (HOSPITAL_COMMUNITY): Payer: BLUE CROSS/BLUE SHIELD

## 2017-09-14 ENCOUNTER — Emergency Department (HOSPITAL_COMMUNITY)
Admission: EM | Admit: 2017-09-14 | Discharge: 2017-09-14 | Disposition: A | Discharge status: Home / Self Care | Payer: BLUE CROSS/BLUE SHIELD | Attending: Emergency Medicine | Admitting: Emergency Medicine

## 2017-09-14 ENCOUNTER — Encounter (HOSPITAL_COMMUNITY): Payer: Self-pay

## 2017-09-14 DIAGNOSIS — R55 Syncope and collapse: Secondary | ICD-10-CM | POA: Insufficient documentation

## 2017-09-14 DIAGNOSIS — Z79899 Other long term (current) drug therapy: Secondary | ICD-10-CM | POA: Insufficient documentation

## 2017-09-14 DIAGNOSIS — F17290 Nicotine dependence, other tobacco product, uncomplicated: Secondary | ICD-10-CM | POA: Diagnosis not present

## 2017-09-14 DIAGNOSIS — S0990XA Unspecified injury of head, initial encounter: Secondary | ICD-10-CM | POA: Diagnosis not present

## 2017-09-14 DIAGNOSIS — Y92410 Unspecified street and highway as the place of occurrence of the external cause: Secondary | ICD-10-CM | POA: Diagnosis not present

## 2017-09-14 DIAGNOSIS — R202 Paresthesia of skin: Secondary | ICD-10-CM | POA: Diagnosis not present

## 2017-09-14 DIAGNOSIS — M25511 Pain in right shoulder: Secondary | ICD-10-CM | POA: Diagnosis not present

## 2017-09-14 DIAGNOSIS — Y999 Unspecified external cause status: Secondary | ICD-10-CM | POA: Diagnosis not present

## 2017-09-14 DIAGNOSIS — Y9389 Activity, other specified: Secondary | ICD-10-CM | POA: Insufficient documentation

## 2017-09-14 LAB — COMPREHENSIVE METABOLIC PANEL
ALBUMIN: 3.6 g/dL (ref 3.5–5.0)
ALT: 32 U/L (ref 17–63)
AST: 30 U/L (ref 15–41)
Alkaline Phosphatase: 76 U/L (ref 38–126)
Anion gap: 10 (ref 5–15)
BILIRUBIN TOTAL: 0.6 mg/dL (ref 0.3–1.2)
BUN: 29 mg/dL — AB (ref 6–20)
CO2: 25 mmol/L (ref 22–32)
Calcium: 9.1 mg/dL (ref 8.9–10.3)
Chloride: 103 mmol/L (ref 101–111)
Creatinine, Ser: 1.32 mg/dL — ABNORMAL HIGH (ref 0.61–1.24)
GFR calc Af Amer: >60 mL/min (ref >60)
GFR calc non Af Amer: 56 mL/min — ABNORMAL LOW (ref >60)
GLUCOSE: 136 mg/dL — AB (ref 65–99)
POTASSIUM: 3.6 mmol/L (ref 3.5–5.1)
Sodium: 138 mmol/L (ref 135–145)
Total Protein: 8.4 g/dL — ABNORMAL HIGH (ref 6.5–8.1)

## 2017-09-14 LAB — CBC WITH DIFFERENTIAL/PLATELET
BASOS PCT: 1 %
Basophils Absolute: 0.1 10*3/uL (ref 0.0–0.1)
EOS PCT: 2 %
Eosinophils Absolute: 0.2 10*3/uL (ref 0.0–0.7)
HCT: 36.1 % — ABNORMAL LOW (ref 39.0–52.0)
Hemoglobin: 11.6 g/dL — ABNORMAL LOW (ref 13.0–17.0)
Lymphocytes Relative: 34 %
Lymphs Abs: 3.1 10*3/uL (ref 0.7–4.0)
MCH: 26.9 pg (ref 26.0–34.0)
MCHC: 32.1 g/dL (ref 30.0–36.0)
MCV: 83.6 fL (ref 78.0–100.0)
MONO ABS: 0.8 10*3/uL (ref 0.1–1.0)
Monocytes Relative: 9 %
Neutro Abs: 4.8 10*3/uL (ref 1.7–7.7)
Neutrophils Relative %: 54 %
Platelets: 258 10*3/uL (ref 150–400)
RBC: 4.32 MIL/uL (ref 4.22–5.81)
RDW: 15.4 % (ref 11.5–15.5)
WBC: 8.9 10*3/uL (ref 4.0–10.5)

## 2017-09-14 MED ORDER — CYCLOBENZAPRINE HCL 10 MG PO TABS: 10.0000 mg | ORAL_TABLET | Freq: Every evening | ORAL | 0 refills | Status: AC | PRN

## 2017-09-14 MED ORDER — MORPHINE SULFATE (PF) 4 MG/ML IV SOLN
4.0000 mg | Freq: Once | INTRAVENOUS | Status: AC
  Administered 2017-09-14: 4 mg via INTRAVENOUS
  Filled 2017-09-14: qty 1
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
## 2017-09-14 NOTE — ED Notes (Signed)
Patient transported to MRI 

## 2017-09-14 NOTE — ED Notes (Signed)
ED Provider at bedside. 

## 2017-09-14 NOTE — Discharge Instructions (Signed)
Today you received medications that may make you sleepy or impair your ability to make decisions.  For the next 24 hours please do not drive, operate heavy machinery, care for a small child with out another adult present, or perform any activities that may cause harm to you or someone else if you were to fall asleep or be impaired.   You are being prescribed a medication which may make you sleepy. Please follow up of listed precautions for at least 24 hours after taking one dose.  Please take Ibuprofen (Advil, motrin) and Tylenol (acetaminophen) to relieve your pain.  You may take up to 600 MG (3 pills) of normal strength ibuprofen every 8 hours as needed.  In between doses of ibuprofen you make take tylenol, up to 1,000 mg (two extra strength pills).  Do not take more than 3,000 mg tylenol in a 24 hour period.  Please check all medication labels as many medications such as pain and cold medications may contain tylenol.  Do not drink alcohol while taking these medications.  Do not take other NSAID'S while taking ibuprofen (such as aleve or naproxen).  Please take ibuprofen with food to decrease stomach upset.  The best way to get rid of muscle pain is by taking NSAIDS, using heat, massage therapy, and gentle stretching/range of motion exercises.  Please do not use a sling.  That will only cause you more problems with your shoulder in the long run.
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
## 2017-09-14 NOTE — ED Notes (Signed)
Patient transported to X-ray 
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
## 2018-07-13 ENCOUNTER — Encounter (HOSPITAL_COMMUNITY): Payer: Self-pay | Admitting: Emergency Medicine

## 2018-07-13 ENCOUNTER — Ambulatory Visit (HOSPITAL_COMMUNITY)
Admission: EM | Admit: 2018-07-13 | Discharge: 2018-07-13 | Disposition: A | Discharge status: Home / Self Care | Payer: BLUE CROSS/BLUE SHIELD | Attending: Family Medicine | Admitting: Family Medicine

## 2018-07-13 DIAGNOSIS — S81801A Unspecified open wound, right lower leg, initial encounter: Secondary | ICD-10-CM

## 2018-07-13 DIAGNOSIS — W57XXXA Bitten or stung by nonvenomous insect and other nonvenomous arthropods, initial encounter: Secondary | ICD-10-CM | POA: Insufficient documentation

## 2018-07-13 DIAGNOSIS — R030 Elevated blood-pressure reading, without diagnosis of hypertension: Secondary | ICD-10-CM

## 2018-07-13 DIAGNOSIS — S80862A Insect bite (nonvenomous), left lower leg, initial encounter: Secondary | ICD-10-CM | POA: Insufficient documentation

## 2018-07-13 MED ORDER — SULFAMETHOXAZOLE-TRIMETHOPRIM 800-160 MG PO TABS: 1.0000 | ORAL_TABLET | Freq: Two times a day (BID) | ORAL | 0 refills | Status: AC

## 2018-07-13 NOTE — Discharge Instructions (Signed)
We did lab testing during this visit.  If there are any abnormal findings that require change in medicine or indicate a positive result, you will be notified.  If all of your tests are normal, you will not be called.  I am checking for MRSA Take the antibiotic 2 x a day Wash wound and use antibiotic ointment and a bandaid daily Return if worse at any time,  expect improvement over a few days

## 2018-07-13 NOTE — ED Provider Notes (Signed)
[facility]    CSN: [id number] Arrival date & time: 07/13/18  1820     History   Chief Complaint Chief Complaint  Patient presents with  . Insect Bite    HPI [name] is a 62 y.o. male.   HPI  Patient is here for a wound on his right leg.  It is mildly painful.  No fever or chills.  He thinks is a spider bite.  He states that he was outside cooking on his grill, and came inside with an insect on his leg.  He states that his wife knocked it off his leg and stomped on it.  He thinks it was a brown spider.  She has looked this up and he now he thinks it might have been a brown recluse.  The center of the wound is turned black.  The skin around the area is swollen.  No drainage.  No malaise, nausea vomiting diarrhea, or additional symptoms. Mention to the patient that his blood pressure was quite elevated.  He states it is always high when he goes to the doctor.  He does have a primary care doctor.  He goes for yearly physical.  His blood pressure is always normal.  I have advised him to follow-up with his primary care doctor for his blood pressure.  He states that he feels well with no headache.  Past Medical History:  Diagnosis Date  . Kidney stones     Patient Active Problem List   Diagnosis Date Noted  . RHABDOMYOLYSIS 04/18/2007  . POLYARTHRITIS NOS, MULTIPLE SITES 10/25/2006  . ELEVATED BLOOD PRESSURE WITHOUT DIAGNOSIS OF HYPERTENSION 10/25/2006    Past Surgical History:  Procedure Laterality Date  . INGUINAL HERNIA REPAIR Left 1993  . 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 WRIST FRACTURE SURGERY Left 2003   with hardware       Home Medications    Prior to Admission medications   Medication Sig Start Date End Date Taking? Authorizing Provider  cyclobenzaprine (FLEXERIL) 10 MG tablet Take 1 tablet (10 mg total) at bedtime as needed by mouth for muscle spasms. 09/14/17   Cristina Gong, PA-C  Multiple Vitamin (MULTIVITAMIN WITH  MINERALS) TABS tablet Take 1 tablet by mouth daily.    [provider]  ondansetron (ZOFRAN) 4 MG tablet Take 1 tablet (4 mg total) by mouth every 6 (six) hours. 05/21/15   Antony Madura, PA-C  sulfamethoxazole-trimethoprim (BACTRIM DS,SEPTRA DS) 800-160 MG tablet Take 1 tablet by mouth 2 (two) times daily for 7 days. 07/13/18 07/20/18  Eustace Moore, MD  tamsulosin (FLOMAX) 0.4 MG CAPS capsule Take 1 capsule (0.4 mg total) by mouth daily. 05/21/15   Antony Madura, PA-C    Family History Family History  Problem Relation Age of Onset  . Colon cancer Neg Hx     Social History Social History   Tobacco Use  . Smoking status: Current Some Day Smoker    Types: Cigars  . Smokeless tobacco: Never Used  . Tobacco comment: occasional cigar  Substance Use Topics  . Alcohol use: Yes    Comment: beer several times a year  . Drug use: No     Allergies   Patient has no known allergies.   Review of Systems Review of Systems  Constitutional: Negative for chills and fever.  HENT: Negative for ear pain and sore throat.   Eyes: Negative for pain and visual disturbance.  Respiratory: Negative for cough and  shortness of breath.   Cardiovascular: Negative for chest pain and palpitations.  Gastrointestinal: Negative for abdominal pain and vomiting.  Genitourinary: Negative for dysuria and hematuria.  Musculoskeletal: Negative for arthralgias and back pain.  Skin: Positive for wound. Negative for color change and rash.  Neurological: Negative for seizures and syncope.  All other systems reviewed and are negative.    Physical Exam Triage Vital Signs ED Triage Vitals [07/13/18 1835]  Enc Vitals Group     BP (!) 183/104     Pulse Rate 77     Resp 16     Temp 97.9 F (36.6 C)     Temp Source Oral     SpO2 98 %     Weight      Height      Head Circumference      Peak Flow      Pain Score      Pain Loc      Pain Edu?      Excl. in GC?    No data found.  Updated Vital  Signs BP (!) 183/104 (BP Location: Left Arm)   Pulse 77   Temp 97.9 F (36.6 C) (Oral)   Resp 16   SpO2 98%   Visual Acuity Right Eye Distance:   Left Eye Distance:   Bilateral Distance:    Right Eye Near:   Left Eye Near:    Bilateral Near:     Physical Exam  Constitutional: He appears well-developed and well-nourished. No distress.  HENT:  Head: Normocephalic and atraumatic.  Mouth/Throat: Oropharynx is clear and moist.  Eyes: Pupils are equal, round, and reactive to light. Conjunctivae are normal.  Neck: Normal range of motion.  Cardiovascular: Normal rate.  Pulmonary/Chest: Effort normal. No respiratory distress.  Abdominal: Soft. He exhibits no distension.  Musculoskeletal: Normal range of motion. He exhibits no edema.  Neurological: He is alert.  Skin: Skin is warm and dry.  Right lower leg, lateral aspect has mild soft tissue swelling.  There is a 15 mm hyperpigmented macule with a center approximately 7 mm that has superficial eschar.  This is unroofed.  There is slight bleeding.  There is no purulence.  There is no fluctuance.  There is no skin necrosis.  Vitals reviewed.    UC Treatments / Results  Labs (all labs ordered are listed, but only abnormal results are displayed) Labs Reviewed  AEROBIC CULTURE (SUPERFICIAL SPECIMEN)    EKG None  Radiology No results found.  Procedures Procedures (including critical care time)  Medications Ordered in UC Medications - No data to display  Initial Impression / Assessment and Plan / UC Course  I have reviewed the triage vital signs and the nursing notes.  Pertinent labs & imaging results that were available during my care of the patient were reviewed by me and considered in my medical decision making (see chart for details).     I explained to the patient this may have been an insect bite.  It is not a brown recluse.  There may be soft tissue infection.  This would cause swelling and the pain.  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  I am going to do a culture.  I am going to put him on antibiotics to cover for MRSA.  He will return if worse instead of better.  Wound care discussed. Final Clinical Impressions(s) / UC Diagnoses   Final diagnoses:  Leg wound, right, initial encounter  Elevated blood  pressure reading without diagnosis of hypertension     Discharge Instructions     We did lab testing during this visit.  If there are any abnormal findings that require change in medicine or indicate a positive result, you will be notified.  If all of your tests are normal, you will not be called.  I am checking for MRSA Take the antibiotic 2 x a day Wash wound and use antibiotic ointment and a bandaid daily Return if worse at any time,  expect improvement over a few days     ED Prescriptions    Medication Sig Dispense Auth. Provider   sulfamethoxazole-trimethoprim (BACTRIM DS,SEPTRA DS) 800-160 MG tablet Take 1 tablet by mouth 2 (two) times daily for 7 days. 14 tablet Eustace Moore, MD     Controlled Substance Prescriptions [place] Controlled Substance Registry consulted? Not Applicable   Eustace Moore, MD 07/13/18 Jerene Bears

## 2018-07-13 NOTE — ED Triage Notes (Signed)
Pt here for right lower leg possible spider bite x 2 days

## 2018-07-16 LAB — AEROBIC CULTURE W GRAM STAIN (SUPERFICIAL SPECIMEN): Special Requests: NORMAL

## 2018-07-16 LAB — AEROBIC CULTURE  (SUPERFICIAL SPECIMEN)
CULTURE: NO GROWTH
GRAM STAIN: NONE SEEN

## 2020-11-05 ENCOUNTER — Other Ambulatory Visit (HOSPITAL_COMMUNITY): Payer: Self-pay | Admitting: Family Medicine

## 2020-11-05 DIAGNOSIS — I509 Heart failure, unspecified: Secondary | ICD-10-CM

## 2020-11-19 ENCOUNTER — Other Ambulatory Visit (HOSPITAL_COMMUNITY): Payer: BLUE CROSS/BLUE SHIELD

## 2020-11-22 ENCOUNTER — Encounter (HOSPITAL_COMMUNITY): Payer: Self-pay | Admitting: Family Medicine

## 2020-11-29 ENCOUNTER — Telehealth (HOSPITAL_COMMUNITY): Payer: Self-pay | Admitting: Family Medicine

## 2020-11-29 NOTE — Telephone Encounter (Signed)
Just an FYI. We have made several attempts to contact this patient including sending a letter to schedule or reschedule their echocardiogram. We will be removing the patient from the echo WQ.   11/28/20 [facility] to schedule @1548  evd 11/22/2020 NO SHOW- MAILED LETTER [facility] 11/1120 [facility] to schedule @ 2:31/[facility]      Thank you

## 2021-02-10 DIAGNOSIS — I509 Heart failure, unspecified: Secondary | ICD-10-CM | POA: Diagnosis not present

## 2021-02-10 DIAGNOSIS — I110 Hypertensive heart disease with heart failure: Secondary | ICD-10-CM | POA: Diagnosis not present

## 2021-03-11 ENCOUNTER — Ambulatory Visit (HOSPITAL_COMMUNITY): Payer: Medicare Other | Attending: Cardiovascular Disease

## 2021-03-11 ENCOUNTER — Other Ambulatory Visit: Payer: Self-pay

## 2021-03-11 DIAGNOSIS — I509 Heart failure, unspecified: Secondary | ICD-10-CM | POA: Insufficient documentation

## 2021-03-11 LAB — ECHOCARDIOGRAM COMPLETE
Area-P 1/2: 1.68 cm2
S' Lateral: 2.9 cm

## 2021-04-15 DIAGNOSIS — R001 Bradycardia, unspecified: Secondary | ICD-10-CM | POA: Diagnosis not present

## 2021-04-15 DIAGNOSIS — I110 Hypertensive heart disease with heart failure: Secondary | ICD-10-CM | POA: Diagnosis not present

## 2021-04-15 DIAGNOSIS — I509 Heart failure, unspecified: Secondary | ICD-10-CM | POA: Diagnosis not present

## 2021-04-15 DIAGNOSIS — N200 Calculus of kidney: Secondary | ICD-10-CM | POA: Diagnosis not present

## 2021-06-05 DIAGNOSIS — I10 Essential (primary) hypertension: Secondary | ICD-10-CM | POA: Diagnosis not present

## 2021-06-13 DIAGNOSIS — I10 Essential (primary) hypertension: Secondary | ICD-10-CM | POA: Diagnosis not present

## 2024-10-11 ENCOUNTER — Emergency Department (HOSPITAL_COMMUNITY)

## 2024-10-11 ENCOUNTER — Encounter (HOSPITAL_COMMUNITY): Payer: Self-pay

## 2024-10-11 ENCOUNTER — Other Ambulatory Visit: Payer: Self-pay

## 2024-10-11 ENCOUNTER — Emergency Department (HOSPITAL_COMMUNITY)
Admission: EM | Admit: 2024-10-11 | Discharge: 2024-10-11 | Disposition: A | Discharge status: Home / Self Care | Attending: Emergency Medicine | Admitting: Emergency Medicine

## 2024-10-11 DIAGNOSIS — Z79899 Other long term (current) drug therapy: Secondary | ICD-10-CM | POA: Insufficient documentation

## 2024-10-11 DIAGNOSIS — Y92009 Unspecified place in unspecified non-institutional (private) residence as the place of occurrence of the external cause: Secondary | ICD-10-CM | POA: Insufficient documentation

## 2024-10-11 DIAGNOSIS — N3091 Cystitis, unspecified with hematuria: Secondary | ICD-10-CM | POA: Insufficient documentation

## 2024-10-11 DIAGNOSIS — R55 Syncope and collapse: Secondary | ICD-10-CM | POA: Insufficient documentation

## 2024-10-11 DIAGNOSIS — W19XXXA Unspecified fall, initial encounter: Secondary | ICD-10-CM | POA: Insufficient documentation

## 2024-10-11 LAB — URINE DRUG SCREEN
Amphetamines: NEGATIVE
Barbiturates: NEGATIVE
Benzodiazepines: NEGATIVE
Cocaine: NEGATIVE
Fentanyl: NEGATIVE
Methadone Scn, Ur: NEGATIVE
Opiates: NEGATIVE
Tetrahydrocannabinol: POSITIVE — AB

## 2024-10-11 LAB — CBC
HCT: 38.3 % — ABNORMAL LOW (ref 39.0–52.0)
Hemoglobin: 11.8 g/dL — ABNORMAL LOW (ref 13.0–17.0)
MCH: 26.6 pg (ref 26.0–34.0)
MCHC: 30.8 g/dL (ref 30.0–36.0)
MCV: 86.5 fL (ref 80.0–100.0)
Platelets: 309 K/uL (ref 150–400)
RBC: 4.43 MIL/uL (ref 4.22–5.81)
RDW: 15 % (ref 11.5–15.5)
WBC: 9.8 K/uL (ref 4.0–10.5)
nRBC: 0 % (ref 0.0–0.2)

## 2024-10-11 LAB — URINALYSIS, ROUTINE W REFLEX MICROSCOPIC
Bilirubin Urine: NEGATIVE
Glucose, UA: NEGATIVE mg/dL
Ketones, ur: NEGATIVE mg/dL
Nitrite: NEGATIVE
Protein, ur: 30 mg/dL — AB
Specific Gravity, Urine: 1.013 (ref 1.005–1.030)
pH: 6 (ref 5.0–8.0)

## 2024-10-11 LAB — COMPREHENSIVE METABOLIC PANEL WITH GFR
ALT: 22 U/L (ref 0–44)
AST: 26 U/L (ref 15–41)
Albumin: 4 g/dL (ref 3.5–5.0)
Alkaline Phosphatase: 75 U/L (ref 38–126)
Anion gap: 16 — ABNORMAL HIGH (ref 5–15)
BUN: 19 mg/dL (ref 8–23)
CO2: 23 mmol/L (ref 22–32)
Calcium: 9.4 mg/dL (ref 8.9–10.3)
Chloride: 100 mmol/L (ref 98–111)
Creatinine, Ser: 1.34 mg/dL — ABNORMAL HIGH (ref 0.61–1.24)
GFR, Estimated: 57 mL/min — ABNORMAL LOW (ref >60)
Glucose, Bld: 139 mg/dL — ABNORMAL HIGH (ref 70–99)
Potassium: 3.1 mmol/L — ABNORMAL LOW (ref 3.5–5.1)
Sodium: 140 mmol/L (ref 135–145)
Total Bilirubin: 0.4 mg/dL (ref 0.0–1.2)
Total Protein: 8.2 g/dL — ABNORMAL HIGH (ref 6.5–8.1)

## 2024-10-11 LAB — ETHANOL: Alcohol, Ethyl (B): <15 mg/dL (ref <15)

## 2024-10-11 LAB — TROPONIN T, HIGH SENSITIVITY
Troponin T High Sensitivity: 21 ng/L — ABNORMAL HIGH (ref 0–19)
Troponin T High Sensitivity: 25 ng/L — ABNORMAL HIGH (ref 0–19)

## 2024-10-11 MED ORDER — CEPHALEXIN 500 MG PO CAPS: 500.0000 mg | ORAL_CAPSULE | Freq: Four times a day (QID) | ORAL | 0 refills | Status: AC

## 2024-10-11 MED ORDER — GUAIFENESIN-DM 100-10 MG/5ML PO SYRP
5.0000 mL | ORAL_SOLUTION | Freq: Once | ORAL | Status: AC
  Administered 2024-10-11: 5 mL via ORAL
  Filled 2024-10-11: qty 5

## 2024-10-11 MED ORDER — SODIUM CHLORIDE 0.9 % IV BOLUS
1000.0000 mL | Freq: Once | INTRAVENOUS | Status: AC
  Administered 2024-10-11: 1000 mL via INTRAVENOUS

## 2024-10-11 MED ORDER — POTASSIUM CHLORIDE CRYS ER 20 MEQ PO TBCR
40.0000 meq | EXTENDED_RELEASE_TABLET | Freq: Once | ORAL | Status: AC
  Administered 2024-10-11: 40 meq via ORAL
  Filled 2024-10-11: qty 2

## 2024-10-11 NOTE — ED Notes (Signed)
 Edp aware of possible aspiration, sats borderline low and not on oxygen at home, did vomit.

## 2024-10-11 NOTE — ED Triage Notes (Signed)
 Pt wife said he fell at the house around 5:35pm. EMS came and pt had a seizure upon arrival. Pt took two honey enhancements prior to seizure. Pt vomited right before the seizure.

## 2024-10-11 NOTE — Discharge Instructions (Signed)
 Follow-up with your doctor as planned in the next couple weeks for recheck

## 2024-10-11 NOTE — ED Notes (Signed)
 Pt taken to ct

## 2024-10-11 NOTE — ED Triage Notes (Signed)
 Pt O2 stats are 88%  Room air and does not use oxygen regularly at home. Pt is having a consistent cough. Possible aspiration. Pt is currently on 2 liters at 91%

## 2024-10-11 NOTE — ED Notes (Signed)
 Pt O2 stats are dropping in the room, Pt O2 is 93% on [place] at this time.

## 2024-10-13 LAB — URINE CULTURE: Culture: NO GROWTH

## 2024-10-13 NOTE — ED Provider Notes (Signed)
 [place] EMERGENCY DEPARTMENT AT [facility] Provider Note   CSN: [id number] Arrival date & time: 10/11/24  8171     Patient presents with: [name] is a 68 y.o. male.   Patient had a syncopal episode after using some medication to help with sexual enhancement.  Patient is alert and oriented now.  The history is provided by the patient and medical records. No language interpreter was used.  Fall This is a new problem. The current episode started less than 1 hour ago. The problem occurs rarely. The problem has been resolved. Pertinent negatives include no chest pain, no abdominal pain and no headaches. Nothing aggravates the symptoms. Nothing relieves the symptoms. He has tried nothing for the symptoms. The treatment provided no relief.       Prior to Admission medications   Medication Sig Start Date End Date Taking? Authorizing Provider  cephALEXin  (KEFLEX ) 500 MG capsule Take 1 capsule (500 mg total) by mouth 4 (four) times daily. 10/11/24  Yes Minta Fair, MD  cyclobenzaprine  (FLEXERIL ) 10 MG tablet Take 1 tablet (10 mg total) at bedtime as needed by mouth for muscle spasms. 09/14/17   Windle Almarie ORN, PA-C  Multiple Vitamin (MULTIVITAMIN WITH MINERALS) TABS tablet Take 1 tablet by mouth daily.    [provider]  ondansetron  (ZOFRAN ) 4 MG tablet Take 1 tablet (4 mg total) by mouth every 6 (six) hours. 05/21/15   Keith Sor, PA-C  tamsulosin  (FLOMAX ) 0.4 MG CAPS capsule Take 1 capsule (0.4 mg total) by mouth daily. 05/21/15   Keith Sor, PA-C    Allergies: Patient has no known allergies.    Review of Systems  Constitutional:  Negative for appetite change and fatigue.  HENT:  Negative for congestion, ear discharge and sinus pressure.   Eyes:  Negative for discharge.  Respiratory:  Negative for cough.   Cardiovascular:  Negative for chest pain.  Gastrointestinal:  Negative for abdominal pain and diarrhea.  Genitourinary:  Negative  for frequency and hematuria.  Musculoskeletal:  Negative for back pain.  Skin:  Negative for rash.  Neurological:  Positive for syncope. Negative for seizures and headaches.  Psychiatric/Behavioral:  Negative for hallucinations.     Updated Vital Signs BP (!) 161/78   Pulse 77   Temp 97.8 F (36.6 C) (Oral)   Resp 19   Ht 6' 4 (1.93 m)   Wt 102.1 kg   SpO2 98%   BMI 27.39 kg/m   Physical Exam Vitals and nursing note reviewed.  Constitutional:      Appearance: He is well-developed.  HENT:     Head: Normocephalic.     Nose: Nose normal.  Eyes:     General: No scleral icterus.    Conjunctiva/sclera: Conjunctivae normal.  Neck:     Thyroid: No thyromegaly.  Cardiovascular:     Rate and Rhythm: Normal rate and regular rhythm.     Heart sounds: No murmur heard.    No friction rub. No gallop.  Pulmonary:     Breath sounds: No stridor. No wheezing or rales.  Chest:     Chest wall: No tenderness.  Abdominal:     General: There is no distension.     Tenderness: There is no abdominal tenderness. There is no rebound.  Musculoskeletal:        General: Normal range of motion.     Cervical back: Neck supple.  Lymphadenopathy:     Cervical: No cervical adenopathy.  Skin:  Findings: No erythema or rash.  Neurological:     Mental Status: He is alert and oriented to person, place, and time.     Motor: No abnormal muscle tone.     Coordination: Coordination normal.  Psychiatric:        Behavior: Behavior normal.     (all labs ordered are listed, but only abnormal results are displayed) Labs Reviewed  COMPREHENSIVE METABOLIC PANEL WITH GFR - Abnormal; Notable for the following components:      Result Value   Potassium 3.1 (*)    Glucose, Bld 139 (*)    Creatinine, Ser 1.34 (*)    Total Protein 8.2 (*)    GFR, Estimated 57 (*)    Anion gap 16 (*)    All other components within normal limits  CBC - Abnormal; Notable for the following components:   Hemoglobin 11.8 (*)     HCT 38.3 (*)    All other components within normal limits  URINALYSIS, ROUTINE W REFLEX MICROSCOPIC - Abnormal; Notable for the following components:   APPearance HAZY (*)    Hgb urine dipstick MODERATE (*)    Protein, ur 30 (*)    Leukocytes,Ua TRACE (*)    Bacteria, UA RARE (*)    All other components within normal limits  URINE DRUG SCREEN - Abnormal; Notable for the following components:   Tetrahydrocannabinol POSITIVE (*)    All other components within normal limits  TROPONIN T, HIGH SENSITIVITY - Abnormal; Notable for the following components:   Troponin T High Sensitivity 21 (*)    All other components within normal limits  TROPONIN T, HIGH SENSITIVITY - Abnormal; Notable for the following components:   Troponin T High Sensitivity 25 (*)    All other components within normal limits  URINE CULTURE  ETHANOL  CBG MONITORING, ED    EKG: EKG Interpretation Date/Time:  Wednesday October 11 2024 18:46:11 EST Ventricular Rate:  74 PR Interval:    QRS Duration:  103 QT Interval:  402 QTC Calculation: 446 R Axis:   -46  Text Interpretation: Atrial flutter LAD, consider left anterior fascicular block Left ventricular hypertrophy Borderline T abnormalities, lateral leads Confirmed by Suzette Pac [phone]) on 10/11/2024 8:59:37 PM  Radiology: CT Head Wo Contrast Result Date: 10/11/2024 EXAM: CT HEAD WITHOUT CONTRAST 10/11/2024 07:40:00 PM TECHNIQUE: CT of the head was performed without the administration of intravenous contrast. Automated exposure control, iterative reconstruction, and/or weight based adjustment of the mA/kV was utilized to reduce the radiation dose to as low as reasonably achievable. COMPARISON: 11 / 6 / 18 CLINICAL HISTORY: Syncope/presyncope, cerebrovascular cause suspected FINDINGS: BRAIN AND VENTRICLES: No acute hemorrhage. No evidence of acute infarct. Moderate patchy white matter hypodensity compatible with chronic small vessel ischemic change. . No  hydrocephalus. No extra-axial collection. No mass effect or midline shift. ORBITS: No acute abnormality. SINUSES: Mild mucosal thickening in sinuses. SOFT TISSUES AND SKULL: No acute soft tissue abnormality. No skull fracture. IMPRESSION: 1. No acute intracranial abnormality. Electronically signed by: Norman Gatlin MD 10/11/2024 08:43 PM EST RP Workstation: [id number]   DG Chest Portable 1 View Result Date: 10/11/2024 CLINICAL DATA:  vomiting/low sat/possible aspiration EXAM: PORTABLE CHEST - 1 VIEW COMPARISON:  September 14, 2017 FINDINGS: Low lung volumes with streaky bibasilar atelectasis. No focal airspace consolidation, pleural effusion, or pneumothorax. Mild cardiomegaly. Tortuous aorta with aortic atherosclerosis. No acute fracture or destructive lesions. Multilevel thoracic osteophytosis. Right AC joint osteoarthritis. IMPRESSION: 1. Low lung volumes with streaky bibasilar atelectasis. 2. Unchanged  mild cardiomegaly. Electronically Signed   By: Rogelia Myers M.D.   On: 10/11/2024 19:40     Procedures   Medications Ordered in the ED  sodium chloride  0.9 % bolus 1,000 mL (0 mLs Intravenous Stopped 10/11/24 2113)  guaiFENesin -dextromethorphan  (ROBITUSSIN DM) 100-10 MG/5ML syrup 5 mL (5 mLs Oral Given 10/11/24 2127)  potassium chloride  SA (KLOR-CON  M) CR tablet 40 mEq (40 mEq Oral Given 10/11/24 2137)                                    Medical Decision Making Amount and/or Complexity of Data Reviewed Labs: ordered. Radiology: ordered.  Risk OTC drugs. Prescription drug management.  Syncope most likely related to taking a suppose Flexeril  enhancement.  Patient also with urinary tract infection.  Patient is back to normal and will follow-up with his family doctor     Final diagnoses:  Syncope, unspecified syncope type  Hematuria due to cystitis    ED Discharge Orders          Ordered    cephALEXin  (KEFLEX ) 500 MG capsule  4 times daily        10/11/24 2211                Suzette Pac, MD 10/13/24 1216
